# Patient Record
Sex: Female | Born: 1985 | Race: White | Hispanic: No | Smoking: Never smoker
Health system: Southern US, Community
[De-identification: ages and names within clinical notes are randomized; demographics above are authoritative.]

## PROBLEM LIST (undated history)

## (undated) DIAGNOSIS — F32A Depression, unspecified: Secondary | ICD-10-CM

## (undated) DIAGNOSIS — F419 Anxiety disorder, unspecified: Secondary | ICD-10-CM

## (undated) DIAGNOSIS — F329 Major depressive disorder, single episode, unspecified: Secondary | ICD-10-CM

## (undated) DIAGNOSIS — G4700 Insomnia, unspecified: Secondary | ICD-10-CM

## (undated) HISTORY — PX: CHOLECYSTECTOMY: SHX55

---

## 2017-09-30 ENCOUNTER — Ambulatory Visit: Payer: Self-pay | Admitting: Family Medicine

## 2017-10-27 ENCOUNTER — Ambulatory Visit: Payer: Self-pay | Admitting: Family Medicine

## 2017-11-18 ENCOUNTER — Telehealth: Payer: Self-pay | Admitting: Family Medicine

## 2017-11-18 NOTE — Telephone Encounter (Signed)
I spoke to patient on phone.  She reports worsening depression with recent onset of children's removal from the home.  She denies SI or HI.  She likely is having a reactive depression, which is appropriate.  She is currently on antidepressants per her report.  While I am formally not seeing it patient, I did recommend that if she is having any worsening depressive symptoms and develops SI or HI that she be seen emergently in the emergency department.  Otherwise, I will send a note to her PCP with our conversation.  [facility]  I will continue to follow her children should whomever is caring for them desire to continue bringing them to our office.

## 2017-11-18 NOTE — Telephone Encounter (Signed)
Calling back wants to speak with nurse now. Please advise

## 2017-11-18 NOTE — Telephone Encounter (Signed)
Patient states that she spoke with Dr. Reece AgarG at her daughters appointments.  Tracy Contreras 09/04/08 and Tracy Contreras 09/08/09. States that she knows about her back problems that she is having.  Patient called to let her know that she was sick with the flu and strep when she had to cancel her new patient appointments.  Her ex husband called social services and got her kids taken away because she has been taking Hydrocodone 7.5 for her back pain that was not prescribed.  Patient has new patient apt with Yetta BarreJones 3/19 since she wanted the first opening we had for a new patient.  FYI

## 2017-11-19 ENCOUNTER — Encounter (HOSPITAL_COMMUNITY): Payer: Self-pay | Admitting: Emergency Medicine

## 2017-11-19 ENCOUNTER — Other Ambulatory Visit: Payer: Self-pay

## 2017-11-19 ENCOUNTER — Emergency Department (HOSPITAL_COMMUNITY)
Admission: EM | Admit: 2017-11-19 | Discharge: 2017-11-19 | Disposition: A | Discharge status: Home / Self Care | Payer: Managed Care, Other (non HMO) | Attending: Emergency Medicine | Admitting: Emergency Medicine

## 2017-11-19 DIAGNOSIS — R45851 Suicidal ideations: Secondary | ICD-10-CM | POA: Diagnosis not present

## 2017-11-19 DIAGNOSIS — F329 Major depressive disorder, single episode, unspecified: Secondary | ICD-10-CM | POA: Diagnosis not present

## 2017-11-19 DIAGNOSIS — F419 Anxiety disorder, unspecified: Secondary | ICD-10-CM | POA: Insufficient documentation

## 2017-11-19 DIAGNOSIS — F1910 Other psychoactive substance abuse, uncomplicated: Secondary | ICD-10-CM | POA: Diagnosis present

## 2017-11-19 DIAGNOSIS — F17210 Nicotine dependence, cigarettes, uncomplicated: Secondary | ICD-10-CM | POA: Diagnosis not present

## 2017-11-19 HISTORY — DX: Anxiety disorder, unspecified: F41.9

## 2017-11-19 HISTORY — DX: Insomnia, unspecified: G47.00

## 2017-11-19 HISTORY — DX: Depression, unspecified: F32.A

## 2017-11-19 HISTORY — DX: Major depressive disorder, single episode, unspecified: F32.9

## 2017-11-19 LAB — COMPREHENSIVE METABOLIC PANEL
ALBUMIN: 5 g/dL (ref 3.5–5.0)
ALK PHOS: 68 U/L (ref 38–126)
ALT: 21 U/L (ref 14–54)
ANION GAP: 14 (ref 5–15)
AST: 21 U/L (ref 15–41)
BILIRUBIN TOTAL: 1.2 mg/dL (ref 0.3–1.2)
BUN: 24 mg/dL — ABNORMAL HIGH (ref 6–20)
CALCIUM: 10.1 mg/dL (ref 8.9–10.3)
CO2: 26 mmol/L (ref 22–32)
CREATININE: 0.87 mg/dL (ref 0.44–1.00)
Chloride: 104 mmol/L (ref 101–111)
GFR calc non Af Amer: >60 mL/min (ref >60)
GLUCOSE: 90 mg/dL (ref 65–99)
Potassium: 3.8 mmol/L (ref 3.5–5.1)
Sodium: 144 mmol/L (ref 135–145)
TOTAL PROTEIN: 8.6 g/dL — AB (ref 6.5–8.1)

## 2017-11-19 LAB — CBC
HEMATOCRIT: 46.9 % — AB (ref 36.0–46.0)
HEMOGLOBIN: 15.5 g/dL — AB (ref 12.0–15.0)
MCH: 31.2 pg (ref 26.0–34.0)
MCHC: 33 g/dL (ref 30.0–36.0)
MCV: 94.4 fL (ref 78.0–100.0)
Platelets: 313 10*3/uL (ref 150–400)
RBC: 4.97 MIL/uL (ref 3.87–5.11)
RDW: 12.7 % (ref 11.5–15.5)
WBC: 8.4 10*3/uL (ref 4.0–10.5)

## 2017-11-19 LAB — ACETAMINOPHEN LEVEL

## 2017-11-19 LAB — RAPID URINE DRUG SCREEN, HOSP PERFORMED
Amphetamines: NOT DETECTED
BARBITURATES: NOT DETECTED
Benzodiazepines: NOT DETECTED
COCAINE: NOT DETECTED
Opiates: NOT DETECTED
TETRAHYDROCANNABINOL: POSITIVE — AB

## 2017-11-19 LAB — PREGNANCY, URINE: PREG TEST UR: NEGATIVE

## 2017-11-19 LAB — SALICYLATE LEVEL: Salicylate Lvl: <7 mg/dL (ref 2.8–30.0)

## 2017-11-19 LAB — ETHANOL: Alcohol, Ethyl (B): <10 mg/dL (ref <10)

## 2017-11-19 MED ORDER — LORAZEPAM 1 MG PO TABS
1.0000 mg | ORAL_TABLET | Freq: Once | ORAL | Status: AC
  Administered 2017-11-19: 1 mg via ORAL
  Filled 2017-11-19: qty 1

## 2017-11-19 NOTE — ED Triage Notes (Signed)
Patient c/o severe depression with SI. Denies any HI. Per patient no attempts just thoughts. Per patient takes Celexa 40mg  and trazodone in which she has not been taking. Patient also had kids taken from her Tuesday per family. 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  Patient states last  took Lortab 7.5mg  2 days ago.
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
## 2017-11-19 NOTE — Telephone Encounter (Signed)
Noted, and since not under our care yet, she should seek emergent medical attention if needed.

## 2017-11-19 NOTE — Discharge Instructions (Signed)
Please take advantage of the resources provided to you to facilitate outpatient therapy if you are unable to follow-up with your previously scheduled rehabilitation center.  Return for concerning changes in your condition.

## 2017-11-19 NOTE — Telephone Encounter (Signed)
Patient is in the ER now. 

## 2017-11-19 NOTE — Consult Note (Signed)
Tele Assessment   Sat in on telepsych assessment with TTS   Tracy RanAshley Contreras, 32 y.o., female patient presented to [facility] with complaints of worsening depression and Suicidal ideation.  Patient seen via telepsych by TTS and this provider; chart reviewed and consulted with Dr. Lucianne MussKumar on 11/19/17.  Records indicated patient made several phone calls to ED with questions related to depression and was informed to go to ED if she began to have suicidal/homicidal ideation.    On evaluation Tracy Contreras reports that she has a history of polysubstance use; and that she failed a drug test given by CPS and her children were taken away.  States that she was informed that she needed to get help/into a program.  Patient denies suicidal/self-harm/homicidal ideation, psychosis, and paranoia.  States that she is looking for a program to help with substance use problem.   During evaluation Tracy Contreras is sitting up in chair; she is alert/oriented x 4; calm/cooperative with pleasant affect.  She does not appear to be responding to internal/external stimuli or delusional thoughts.  Patient denies suicidal/self-harm/homicidal ideation, psychosis, and paranoia.  Patient requesting assistance with community resources; and short/long term rehab.  Patient answered question appropriately.  Patient psychiatrically cleared   Recommendation:  [facility]; if no bed available will send information for community resources and short/long term rehab services.  Patient was [facility] in 8 am-3 pm Mon-Fri.    Disposition: No evidence of imminent risk to self or others at present.   Patient does not meet criteria for psychiatric inpatient admission. Refer to [facility] IOP.

## 2017-11-19 NOTE — ED Provider Notes (Signed)
[facility] EMERGENCY DEPARTMENT Provider Note   CSN: [id number] Arrival date & time: 11/19/17  1320     History   Chief Complaint Chief Complaint  Patient presents with  . V70.1    HPI Tracy Contreras is a 32 y.o. female.  HPI Presents with concern of anxiousness, depression, vague suicidal thoughts, and concern for withdrawal. Patient has a history of depression, takes Celexa and trazodone. She notes that she ran out of her trazodone about 3 weeks ago. About that same time, the patient relapsed, using opiate tablets and methamphetamine. Last opiate was 2 days ago, last methamphetamine was last weekend. She notes that 2 days ago her children were removed from her house by social services due to her substance abuse. Since that time she has had worsening anxiousness, depression, some suicidal thoughts, but inconsistently states that she would not kill herself. She has had prior hospitalization for depression and inpatient stays for substance abuse as well. Last detox was about 1 year ago. She denies physical pain, discomfort, complaints. Today she spoke with a rehabilitation facility, was sent here for "detox"   Past Medical History:  Diagnosis Date  . Anxiety   . Depression   . Insomnia     There are no active problems to display for this patient.   Past Surgical History:  Procedure Laterality Date  . CHOLECYSTECTOMY      OB History    No data available       Home Medications    Prior to Admission medications   Medication Sig Start Date End Date Taking? Authorizing Provider  citalopram (CELEXA) 40 MG tablet Take 40 mg by mouth daily.  09/24/17   [provider]    Family History History reviewed. No pertinent family history.  Social History Social History   Tobacco Use  . Smoking status: Current Every Day Smoker  . Smokeless tobacco: Never Used  Substance Use Topics  . Alcohol use: Yes    Frequency: Never    Comment: opiods  . Drug use:  Yes    Types: Methamphetamines    Comment: opiods     Allergies   Amoxicillin; Phenergan [promethazine]; and Zofran [ondansetron hcl]   Review of Systems Review of Systems  Constitutional:       Per HPI, otherwise negative  HENT:       Per HPI, otherwise negative  Respiratory:       Per HPI, otherwise negative  Cardiovascular:       Per HPI, otherwise negative  Gastrointestinal: Negative for vomiting.  Endocrine:       Negative aside from HPI  Genitourinary:       Neg aside from HPI   Musculoskeletal:       Per HPI, otherwise negative  Skin: Negative.   Allergic/Immunologic: Negative for immunocompromised state.  Neurological: Negative for syncope.  Psychiatric/Behavioral: Positive for decreased concentration, dysphoric mood, sleep disturbance and suicidal ideas. The patient is nervous/anxious.      Physical Exam Updated Vital Signs BP 117/88 (BP Location: Right Arm)   Pulse 96   Temp 98.1 F (36.7 C) (Oral)   Resp 18   Ht 5\' 2"  (1.575 m)   Wt 63.5 kg (140 lb)   LMP 11/02/2017   SpO2 100%   BMI 25.61 kg/m   Physical Exam  Constitutional: She is oriented to person, place, and time. She appears well-developed and well-nourished. No distress.  HENT:  Head: Normocephalic and atraumatic.  Eyes: Conjunctivae and EOM are normal.  Cardiovascular: Normal rate and regular rhythm.  Pulmonary/Chest: Effort normal and breath sounds normal. No stridor. No respiratory distress.  Abdominal: She exhibits no distension.  Musculoskeletal: She exhibits no edema.  Neurological: She is alert and oriented to person, place, and time. No cranial nerve deficit.  Skin: Skin is warm and dry.  Psychiatric: Her mood appears anxious. Cognition and memory are not impaired. She exhibits a depressed mood.  Nursing note and vitals reviewed.    ED Treatments / Results  Labs (all labs ordered are listed, but only abnormal results are displayed) Labs Reviewed  COMPREHENSIVE METABOLIC  PANEL - Abnormal; Notable for the following components:      Result Value   BUN 24 (*)    Total Protein 8.6 (*)    All other components within normal limits  ACETAMINOPHEN LEVEL - Abnormal; Notable for the following components:   Acetaminophen (Tylenol), Serum <10 (*)    All other components within normal limits  CBC - Abnormal; Notable for the following components:   Hemoglobin 15.5 (*)    HCT 46.9 (*)    All other components within normal limits  RAPID URINE DRUG SCREEN, HOSP PERFORMED - Abnormal; Notable for the following components:   Tetrahydrocannabinol POSITIVE (*)    All other components within normal limits  ETHANOL  SALICYLATE LEVEL  PREGNANCY, URINE     Procedures Procedures (including critical care time)  Medications Ordered in ED Medications  LORazepam (ATIVAN) tablet 1 mg (not administered)     Initial Impression / Assessment and Plan / ED Course  I have reviewed the triage vital signs and the nursing notes.  Pertinent labs & imaging results that were available during my care of the patient were reviewed by me and considered in my medical decision making (see chart for details).  Young female presents with concern of anxiousness, depression, ongoing withdrawal from opiates and methamphetamine. No evidence for complicated withdrawal, no distress, the patient is awake and alert, speaking clearly, hemodynamically unremarkable. Patient also has some vague suicidal thoughts, though no clear plan. Given her history of depression, high risk profile for suicide, the patient was medically cleared for psychiatric evaluation.  6:30 PM Patient in no distress, now accompanied by female companion. No evidence for complicated withdrawal. Behavior health recommends outpatient follow-up with behavioral health and substance abuse counseling Given the patient's prior acknowledgment of no firm suicidal plan, this endorsement, the patient was discharged with close outpatient  follow-up. Final Clinical Impressions(s) / ED Diagnoses   Final diagnoses:  Polysubstance abuse (HCC)      Gerhard Munch, MD 11/19/17 [phone]

## 2017-12-01 ENCOUNTER — Ambulatory Visit: Payer: Managed Care, Other (non HMO) | Admitting: Physician Assistant

## 2018-01-16 ENCOUNTER — Encounter (HOSPITAL_COMMUNITY): Payer: Self-pay | Admitting: Emergency Medicine

## 2018-01-16 ENCOUNTER — Other Ambulatory Visit: Payer: Self-pay

## 2018-01-16 DIAGNOSIS — L02214 Cutaneous abscess of groin: Secondary | ICD-10-CM | POA: Insufficient documentation

## 2018-01-16 DIAGNOSIS — Z5321 Procedure and treatment not carried out due to patient leaving prior to being seen by health care provider: Secondary | ICD-10-CM | POA: Insufficient documentation

## 2018-01-16 NOTE — ED Notes (Signed)
Called no answer

## 2018-01-16 NOTE — ED Triage Notes (Signed)
Patient reports an abscess to her L groin area that started yesterday.

## 2018-01-17 ENCOUNTER — Other Ambulatory Visit: Payer: Self-pay

## 2018-01-17 ENCOUNTER — Emergency Department (HOSPITAL_COMMUNITY)
Admission: EM | Admit: 2018-01-17 | Discharge: 2018-01-17 | Disposition: A | Discharge status: Home / Self Care | Payer: Managed Care, Other (non HMO) | Attending: Emergency Medicine | Admitting: Emergency Medicine

## 2018-01-17 ENCOUNTER — Encounter (HOSPITAL_COMMUNITY): Payer: Self-pay | Admitting: Emergency Medicine

## 2018-01-17 ENCOUNTER — Emergency Department (HOSPITAL_COMMUNITY)
Admission: EM | Admit: 2018-01-17 | Discharge: 2018-01-18 | Disposition: A | Discharge status: Home / Self Care | Payer: Self-pay | Attending: Emergency Medicine | Admitting: Emergency Medicine

## 2018-01-17 DIAGNOSIS — Z79899 Other long term (current) drug therapy: Secondary | ICD-10-CM | POA: Insufficient documentation

## 2018-01-17 DIAGNOSIS — L739 Follicular disorder, unspecified: Secondary | ICD-10-CM | POA: Insufficient documentation

## 2018-01-17 NOTE — ED Triage Notes (Signed)
Patient with abscess to her L groin area, onset 2 days ago.

## 2018-01-17 NOTE — ED Triage Notes (Signed)
Delay explained to patient, given warm blanket.

## 2018-01-17 NOTE — ED Notes (Signed)
Called no answer

## 2018-01-18 MED ORDER — TRAMADOL HCL 50 MG PO TABS
50.0000 mg | ORAL_TABLET | Freq: Four times a day (QID) | ORAL | 0 refills | Status: AC | PRN
Start: 2018-01-18 — End: ?

## 2018-01-18 MED ORDER — SULFAMETHOXAZOLE-TRIMETHOPRIM 800-160 MG PO TABS: 1.0000 | ORAL_TABLET | Freq: Two times a day (BID) | ORAL | 0 refills | Status: AC

## 2018-01-18 MED ORDER — SULFAMETHOXAZOLE-TRIMETHOPRIM 800-160 MG PO TABS
1.0000 | ORAL_TABLET | Freq: Once | ORAL | Status: AC
  Administered 2018-01-18: 1 via ORAL
  Filled 2018-01-18: qty 1

## 2018-01-18 NOTE — ED Provider Notes (Signed)
[facility] EMERGENCY DEPARTMENT Provider Note   CSN: [id number] Arrival date & time: 01/17/18  1900     History   Chief Complaint Chief Complaint  Patient presents with  . Abscess    HPI Tracy Contreras is a 32 y.o. female.  She presents to the emergency department for evaluation of skin infection.  Patient reports a history of recurrent skin infections and abscesses.  She started to notice a tender swollen area in the left groin 2 days ago.  She reports that there is a large amount of pressure there, as it keeps rubbing on her jeans.  She stuck a needle in the last night and thinks there might of been some pus drainage.  She has not had a fever.     Past Medical History:  Diagnosis Date  . Anxiety   . Depression   . Insomnia     There are no active problems to display for this patient.   Past Surgical History:  Procedure Laterality Date  . CHOLECYSTECTOMY       OB History    Gravida  3   Para  3   Term  3   Preterm      AB      Living        SAB      TAB      Ectopic      Multiple      Live Births               Home Medications    Prior to Admission medications   Medication Sig Start Date End Date Taking? Authorizing Provider  citalopram (CELEXA) 40 MG tablet Take 40 mg by mouth daily.  09/24/17   [provider]  sulfamethoxazole-trimethoprim (BACTRIM DS,SEPTRA DS) 800-160 MG tablet Take 1 tablet by mouth 2 (two) times daily. 01/18/18   Gilda Crease, MD  traMADol (ULTRAM) 50 MG tablet Take 1 tablet (50 mg total) by mouth every 6 (six) hours as needed. 01/18/18   Gilda Crease, MD    Family History History reviewed. No pertinent family history.  Social History Social History   Tobacco Use  . Smoking status: Never Smoker  . Smokeless tobacco: Never Used  Substance Use Topics  . Alcohol use: Not Currently    Frequency: Never  . Drug use: Not Currently    Types: Methamphetamines    Comment: opiods      Allergies   Amoxicillin; Phenergan [promethazine]; and Zofran [ondansetron hcl]   Review of Systems Review of Systems  Skin: Positive for color change and wound.  All other systems reviewed and are negative.    Physical Exam Updated Vital Signs BP 102/64 (BP Location: Right Arm)   Pulse 65   Temp 97.7 F (36.5 C) (Oral)   Resp 16   Ht  (1.575 m)   Wt 61.2 kg (135 lb)   LMP 01/02/2018   SpO2 99%   BMI 24.69 kg/m   Physical Exam  Constitutional: She is oriented to person, place, and time. She appears well-developed and well-nourished. No distress.  HENT:  Head: Normocephalic and atraumatic.  Right Ear: Hearing normal.  Left Ear: Hearing normal.  Nose: Nose normal.  Mouth/Throat: Oropharynx is clear and moist and mucous membranes are normal.  Eyes: Pupils are equal, round, and reactive to light. Conjunctivae and EOM are normal.  Neck: Normal range of motion. Neck supple.  Cardiovascular: Regular rhythm, S1 normal and S2 normal. Exam reveals no  gallop and no friction rub.  No murmur heard. Pulmonary/Chest: Effort normal and breath sounds normal. No respiratory distress. She exhibits no tenderness.  Abdominal: Soft. Normal appearance and bowel sounds are normal. There is no hepatosplenomegaly. There is no tenderness. There is no rebound, no guarding, no tenderness at McBurney's point and negative Murphy's sign. No hernia.  Musculoskeletal: Normal range of motion.  Neurological: She is alert and oriented to person, place, and time. She has normal strength. No cranial nerve deficit or sensory deficit. Coordination normal. GCS eye subscore is 4. GCS verbal subscore is 5. GCS motor subscore is 6.  Skin: Skin is warm, dry and intact. No rash noted. No cyanosis.  2.5 cm x 5 cm area of erythema left inguinal region with tenderness, no fluctuance, no induration  Psychiatric: She has a normal mood and affect. Her speech is normal and behavior is normal. Thought content  normal.  Nursing note and vitals reviewed.    ED Treatments / Results  Labs (all labs ordered are listed, but only abnormal results are displayed) Labs Reviewed - No data to display  EKG None  Radiology No results found.  Procedures Procedures (including critical care time)  Medications Ordered in ED Medications  sulfamethoxazole-trimethoprim (BACTRIM DS,SEPTRA DS) 800-160 MG per tablet 1 tablet (has no administration in time range)     Initial Impression / Assessment and Plan / ED Course  I have reviewed the triage vital signs and the nursing notes.  Pertinent labs & imaging results that were available during my care of the patient were reviewed by me and considered in my medical decision making (see chart for details).     With small area of cellulitis and possible early abscess without drainable fluid collection.  Will initiate antibiotic coverage.  Patient counseled that she might need to return for incision and drainage if area worsens.  Final Clinical Impressions(s) / ED Diagnoses   Final diagnoses:  Folliculitis    ED Discharge Orders        Ordered    sulfamethoxazole-trimethoprim (BACTRIM DS,SEPTRA DS) 800-160 MG tablet  2 times daily     01/18/18 0014    traMADol (ULTRAM) 50 MG tablet  Every 6 hours PRN     01/18/18 0014       Gilda Crease, MD 01/18/18 [phone]

## 2018-03-11 ENCOUNTER — Encounter (HOSPITAL_COMMUNITY): Payer: Self-pay | Admitting: Emergency Medicine

## 2018-03-11 ENCOUNTER — Emergency Department (HOSPITAL_COMMUNITY): Payer: 59

## 2018-03-11 ENCOUNTER — Emergency Department (HOSPITAL_COMMUNITY)
Admission: EM | Admit: 2018-03-11 | Discharge: 2018-03-11 | Disposition: A | Discharge status: Home / Self Care | Payer: 59 | Attending: Emergency Medicine | Admitting: Emergency Medicine

## 2018-03-11 ENCOUNTER — Other Ambulatory Visit: Payer: Self-pay

## 2018-03-11 DIAGNOSIS — Y999 Unspecified external cause status: Secondary | ICD-10-CM | POA: Diagnosis not present

## 2018-03-11 DIAGNOSIS — W108XXA Fall (on) (from) other stairs and steps, initial encounter: Secondary | ICD-10-CM | POA: Insufficient documentation

## 2018-03-11 DIAGNOSIS — Y9289 Other specified places as the place of occurrence of the external cause: Secondary | ICD-10-CM | POA: Diagnosis not present

## 2018-03-11 DIAGNOSIS — S93401A Sprain of unspecified ligament of right ankle, initial encounter: Secondary | ICD-10-CM | POA: Diagnosis not present

## 2018-03-11 DIAGNOSIS — Y9389 Activity, other specified: Secondary | ICD-10-CM | POA: Insufficient documentation

## 2018-03-11 DIAGNOSIS — S99911A Unspecified injury of right ankle, initial encounter: Secondary | ICD-10-CM | POA: Diagnosis present

## 2018-03-11 MED ORDER — IBUPROFEN 800 MG PO TABS: 800.0000 mg | ORAL_TABLET | Freq: Three times a day (TID) | ORAL | 0 refills | Status: AC | PRN

## 2018-03-11 MED ORDER — KETOROLAC TROMETHAMINE 30 MG/ML IJ SOLN
30.0000 mg | Freq: Once | INTRAMUSCULAR | Status: AC
  Administered 2018-03-11: 30 mg via INTRAMUSCULAR
  Filled 2018-03-11: qty 1

## 2018-03-11 NOTE — Discharge Instructions (Signed)
As we discussed, you do not have any broken or dislocated bones in your foot or ankle, but you do have an ankle sprain. There is always a chance that a small fracture did not appear on today's x-ray. Please read through the included information about routine injury care (RICE = rest, ice, compression, elevation), and take over-the-counter pain medicine according to label instructions.  If you do not have any reason to avoid ibuprofen, you can also consider taking ibuprofen 800 mg 3 times a day with meals, but do this for no more than 5 days as it may cause to some stomach discomfort over time.  Use crutches if provided and you may bear weight as tolerated.  Follow-up is recommended with the orthopedic surgeon or with your regular doctor. ° ° ° ° ° °

## 2018-03-11 NOTE — ED Triage Notes (Signed)
Patient is c/o right ankle pain. Per patient fell down some stairs yesterday causing her to "roll her ankle." Denies hitting head or LOC. Per patient unable to bear weight on ankle this morning. Some swelling noted.

## 2018-03-11 NOTE — ED Provider Notes (Signed)
Emergency Department Provider Note   I have reviewed the triage vital signs and the nursing notes.   HISTORY  Chief Complaint Ankle Pain   HPI Tracy Contreras is a 32 y.o. female with PMH of anxiety and depression presents to the emergency department for evaluation of right ankle pain, swelling, bruising after "rolling" the ankle yesterday.  She was chasing after her son down some steps when her right ankle turned to the side.  She had severe pain immediately and has been unable to bear any weight on the foot since yesterday.  She denies any numbness or tingling in the foot.  No knee pain.  She does have some discomfort into her right calf.  Her hip discomfort.  No lower back pain.  The patient did not fall or lose consciousness.    Past Medical History:  Diagnosis Date  . Anxiety   . Depression   . Insomnia     There are no active problems to display for this patient.   Past Surgical History:  Procedure Laterality Date  . CHOLECYSTECTOMY      Allergies Amoxicillin; Phenergan [promethazine]; and Zofran [ondansetron hcl]  No family history on file.  Social History Social History   Tobacco Use  . Smoking status: Never Smoker  . Smokeless tobacco: Never Used  Substance Use Topics  . Alcohol use: Not Currently    Frequency: Never  . Drug use: Not Currently    Types: Methamphetamines    Comment: opiods    Review of Systems  Constitutional: No fever/chills Cardiovascular: Denies chest pain. Respiratory: Denies shortness of breath. Gastrointestinal: No abdominal pain.  Musculoskeletal: Negative for back pain. Positive right ankle pain.  Skin: Positive bruising and swelling over the right ankle.  Neurological: Negative for headaches, focal weakness or numbness.  10-point ROS otherwise negative.  ____________________________________________   PHYSICAL EXAM:  VITAL SIGNS: ED Triage Vitals [03/11/18 0858]  Enc Vitals Group     BP 100/75     Pulse Rate  65     Resp 16     Temp 97.7 F (36.5 C)     Temp Source Oral     SpO2 99 %     Weight 140 lb (63.5 kg)     Height 5\' 2"  (1.575 m)     Pain Score 9   Constitutional: Alert and oriented. Well appearing and in no acute distress. Eyes: Conjunctivae are normal.  Head: Atraumatic. Nose: No congestion/rhinnorhea. Mouth/Throat: Mucous membranes are moist. Neck: No stridor.   Cardiovascular: Good peripheral circulation. Normal DP and PT pulses on the right.  Respiratory: Normal respiratory effort.   Gastrointestinal: Soft and nontender. Musculoskeletal: Positive right ankle edema over medial and lateral malleolus with ecchymosis medically and anteriorly. Mild right calf discomfort without bruising. Normal exam of the right achilles tendon.  Neurologic:  Normal speech and language. No gross focal neurologic deficits are appreciated.  Skin:  Skin is warm, dry and intact. Mild bruising of the right ankle as noted above.   ____________________________________________  RADIOLOGY  Dg Ankle Complete Right  Result Date: 03/11/2018 CLINICAL DATA:  Lateral ankle pain after a fall down steps EXAM: RIGHT ANKLE - COMPLETE 3+ VIEW COMPARISON:  None. FINDINGS: The ankle joint appears normal. Alignment is normal. No fracture is seen. IMPRESSION: Negative. Electronically Signed   By: Dwyane Dee M.D.   On: 03/11/2018 09:52    ____________________________________________   PROCEDURES  Procedure(s) performed:   Procedures  None ____________________________________________   INITIAL IMPRESSION /  ASSESSMENT AND PLAN / ED COURSE  Pertinent labs & imaging results that were available during my care of the patient were reviewed by me and considered in my medical decision making (see chart for details).  Patient presents to the emergency department for evaluation of right ankle pain with some swelling and ecchymosis.  Clinically suspect sprain of her fracture but she does have significant tenderness  on exam.  Will obtain plain films of the right ankle and reassess.  She has no proximal fibular tenderness on exam.  Her Achilles exam is normal. Plan for Toradol in the ED for pain.   09:55 AM Plain film of the right ankle reviewed with no fracture or dislocation.  Will treat as ankle sprain with Aircast, crutches, RICE. Advised WBAT at home and call PCP for follow up appointment in the coming week.   At this time, I do not feel there is any life-threatening condition present. I have reviewed and discussed all results (imaging) and exam findings with patient. I have reviewed nursing notes and appropriate previous records.  I feel the patient is safe to be discharged home without further emergent workup. Discussed usual and customary return precautions. Patient and family (if present) verbalize understanding and are comfortable with this plan.  Patient will follow-up with their primary care provider. If they do not have a primary care provider, information for follow-up has been provided to them. All questions have been answered.  ____________________________________________  FINAL CLINICAL IMPRESSION(S) / ED DIAGNOSES  Final diagnoses:  Sprain of right ankle, unspecified ligament, initial encounter     MEDICATIONS GIVEN DURING THIS VISIT:  Medications  ketorolac (TORADOL) 30 MG/ML injection 30 mg (30 mg Intramuscular Given 03/11/18 0943)     NEW OUTPATIENT MEDICATIONS STARTED DURING THIS VISIT:  New Prescriptions   IBUPROFEN (ADVIL,MOTRIN) 800 MG TABLET    Take 1 tablet (800 mg total) by mouth every 8 (eight) hours as needed.    Note:  This document was prepared using Dragon voice recognition software and may include unintentional dictation errors.  Alona BeneJoshua Long, MD Emergency Medicine    Long, Arlyss RepressJoshua G, MD 03/11/18 [phone]

## 2018-03-11 NOTE — ED Notes (Signed)
Patient transported to X-ray 

## 2019-11-01 IMAGING — DX DG ANKLE COMPLETE 3+V*R*
3 series · 3 of 3 positions shown · non-contrast
Comparison: None.

CLINICAL DATA: Lateral ankle pain after a fall down steps

EXAM:
RIGHT ANKLE - COMPLETE 3+ VIEW

[ankle ap]
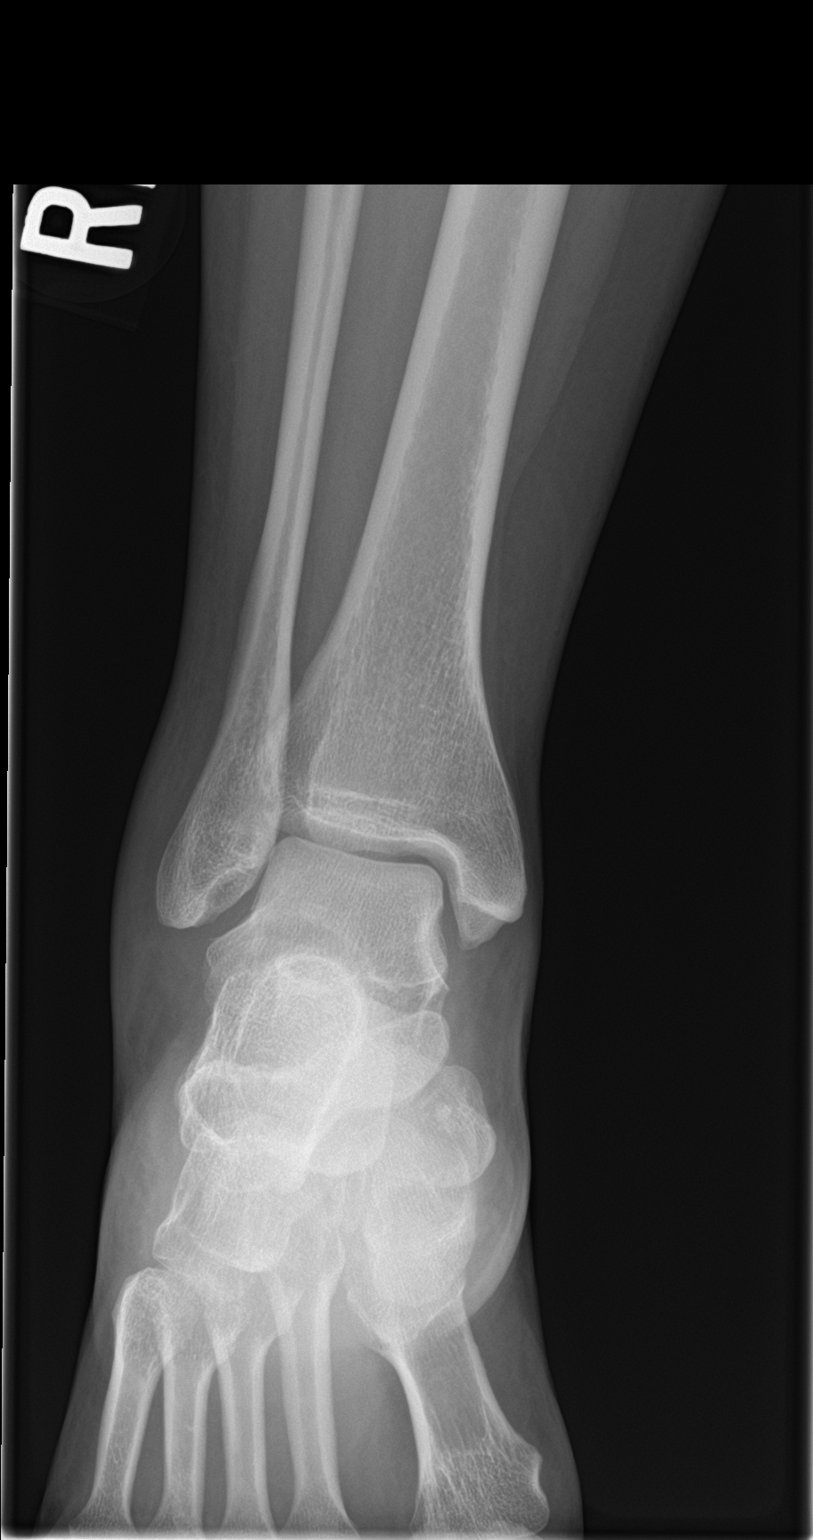

[ankle obl]
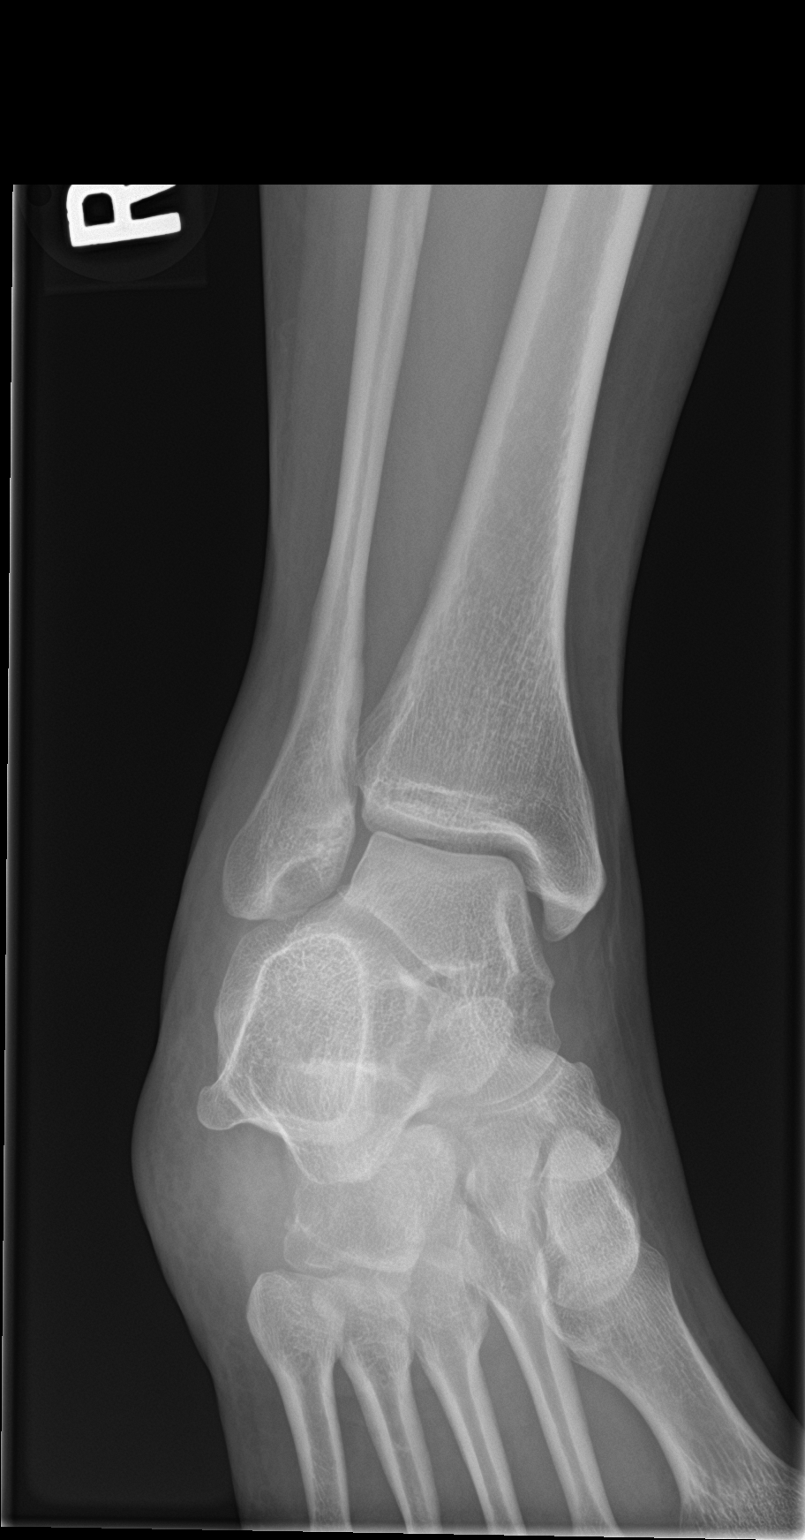

[ankle lat]
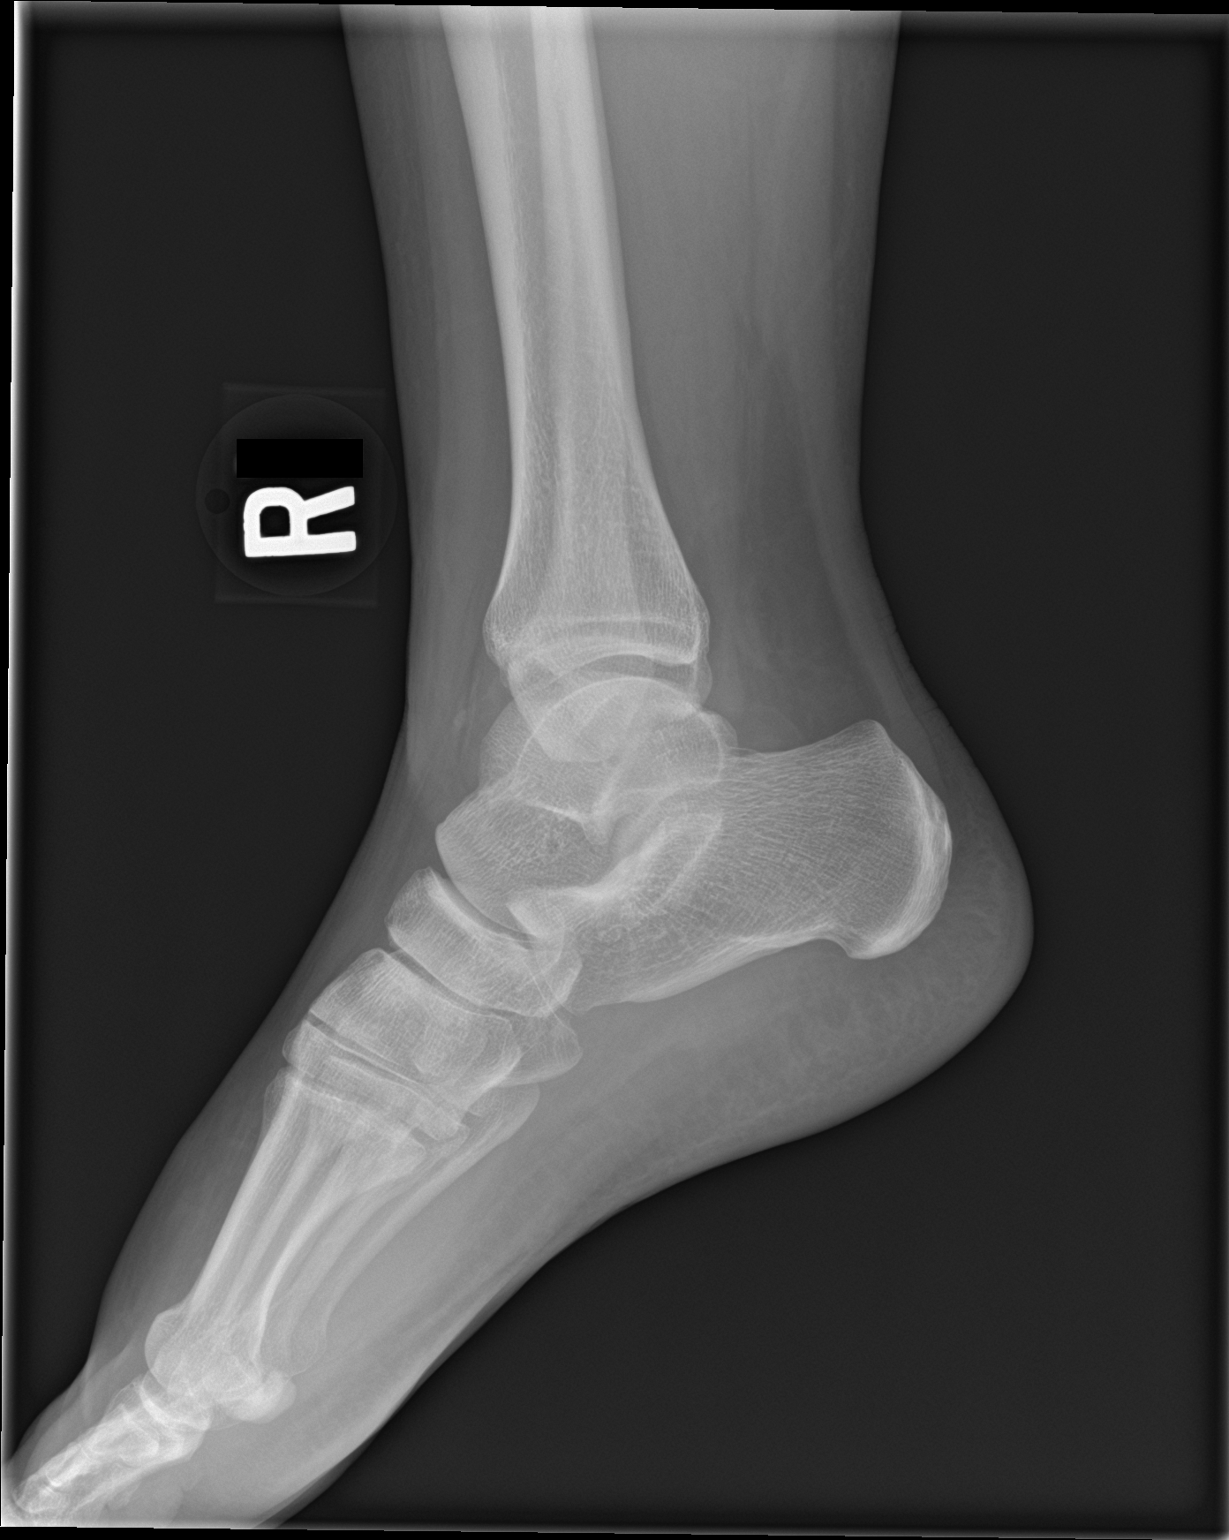

[3 of 3 positions shown; findings below may reference images not displayed]

FINDINGS: The ankle joint appears normal. Alignment is normal. No fracture is
seen.
IMPRESSION: Negative.
